# Patient Record
Sex: Female | Born: 1945 | Race: White | Hispanic: No | Smoking: Former smoker
Health system: Southern US, Community
[De-identification: ages and names within clinical notes are randomized; demographics above are authoritative.]

## PROBLEM LIST (undated history)

## (undated) DIAGNOSIS — E119 Type 2 diabetes mellitus without complications: Secondary | ICD-10-CM

## (undated) DIAGNOSIS — R42 Dizziness and giddiness: Secondary | ICD-10-CM

## (undated) DIAGNOSIS — I739 Peripheral vascular disease, unspecified: Secondary | ICD-10-CM

## (undated) DIAGNOSIS — C801 Malignant (primary) neoplasm, unspecified: Secondary | ICD-10-CM

## (undated) DIAGNOSIS — I10 Essential (primary) hypertension: Secondary | ICD-10-CM

## (undated) DIAGNOSIS — K760 Fatty (change of) liver, not elsewhere classified: Secondary | ICD-10-CM

## (undated) DIAGNOSIS — E785 Hyperlipidemia, unspecified: Secondary | ICD-10-CM

## (undated) DIAGNOSIS — E039 Hypothyroidism, unspecified: Secondary | ICD-10-CM

## (undated) HISTORY — PX: COLON SURGERY: SHX602

## (undated) HISTORY — PX: TONSILLECTOMY: SUR1361

## (undated) HISTORY — PX: HERNIA REPAIR: SHX51

## (undated) HISTORY — PX: EYE SURGERY: SHX253

## (undated) HISTORY — PX: ABDOMINAL HYSTERECTOMY: SHX81

---

## 2004-11-07 ENCOUNTER — Ambulatory Visit: Payer: Self-pay | Admitting: Family Medicine

## 2005-10-04 ENCOUNTER — Ambulatory Visit: Payer: Self-pay | Admitting: Unknown Physician Specialty

## 2005-10-11 ENCOUNTER — Ambulatory Visit: Payer: Self-pay | Admitting: Unknown Physician Specialty

## 2006-02-06 ENCOUNTER — Ambulatory Visit: Payer: Self-pay | Admitting: Unknown Physician Specialty

## 2006-10-23 ENCOUNTER — Ambulatory Visit: Payer: Self-pay | Admitting: Unknown Physician Specialty

## 2008-03-02 ENCOUNTER — Ambulatory Visit: Payer: Self-pay | Admitting: Unknown Physician Specialty

## 2009-03-08 ENCOUNTER — Ambulatory Visit: Payer: Self-pay | Admitting: Unknown Physician Specialty

## 2009-12-23 ENCOUNTER — Ambulatory Visit: Payer: Self-pay | Admitting: Unknown Physician Specialty

## 2010-04-04 ENCOUNTER — Ambulatory Visit: Payer: Self-pay | Admitting: General Surgery

## 2010-04-10 ENCOUNTER — Ambulatory Visit: Payer: Self-pay | Admitting: General Surgery

## 2010-04-11 ENCOUNTER — Ambulatory Visit: Payer: Self-pay | Admitting: General Surgery

## 2010-10-06 ENCOUNTER — Ambulatory Visit: Payer: Self-pay | Admitting: Unknown Physician Specialty

## 2011-02-14 ENCOUNTER — Ambulatory Visit: Payer: Self-pay | Admitting: Unknown Physician Specialty

## 2012-02-19 ENCOUNTER — Ambulatory Visit: Payer: Self-pay | Admitting: Unknown Physician Specialty

## 2012-07-23 ENCOUNTER — Ambulatory Visit: Payer: Self-pay | Admitting: Urology

## 2013-02-19 ENCOUNTER — Ambulatory Visit: Payer: Self-pay | Admitting: Physician Assistant

## 2014-02-24 ENCOUNTER — Ambulatory Visit: Payer: Self-pay | Admitting: Physician Assistant

## 2014-03-05 ENCOUNTER — Ambulatory Visit: Payer: Self-pay | Admitting: Unknown Physician Specialty

## 2014-03-08 LAB — PATHOLOGY REPORT

## 2014-04-26 DIAGNOSIS — M654 Radial styloid tenosynovitis [de Quervain]: Secondary | ICD-10-CM | POA: Insufficient documentation

## 2014-06-02 DIAGNOSIS — E785 Hyperlipidemia, unspecified: Secondary | ICD-10-CM | POA: Insufficient documentation

## 2014-06-08 DIAGNOSIS — N6019 Diffuse cystic mastopathy of unspecified breast: Secondary | ICD-10-CM | POA: Insufficient documentation

## 2014-06-08 DIAGNOSIS — K760 Fatty (change of) liver, not elsewhere classified: Secondary | ICD-10-CM | POA: Insufficient documentation

## 2015-01-22 NOTE — Op Note (Signed)
PATIENT NAME:  Nicole Bennett, Nicole Bennett MR#:  [id number] DATE OF BIRTH:  Feb 28, 1946  DATE OF PROCEDURE:  03/05/2014  SURGEON: Manya Silvas, MD  PROCEDURE: A colonoscopy with polypectomy.   PREOPERATIVE INDICATION: Personal history of colon polyps.   POSTOPERATIVE DIAGNOSES: Colon polyp in the cecum and in the rectosigmoid area, internal hemorrhoids.   MEDICATIONS: Propofol per nurse, CRNA.  DESCRIPTION OF PROCEDURE: The procedure was done on O2 monitor, blood pressure cuff and oximeter. Once the scope was advanced to the cecum without difficulty, in the cecum, a small to medium polyp was seen. Saline lift was performed, and the polyp was removed with a snare polypectomy. One small remnant was removed with the cold biopsy forceps. A clip was placed at the site of the polypectomy with good results. The remainder of the colon showed no significant abnormalities. A small polyp was seen in the rectosigmoid area, and this was removed with cold biopsy jumbo forceps with 2 bites. Internal hemorrhoids were seen. The prep was excellent. Anorectal exam showed internal hemorrhoids.   ASSESSMENT: Colon polyps, internal hemorrhoids. Await pathology report.   ____________________________ Manya Silvas, MD rte:lb D: 03/05/2014 11:41:08 ET T: 03/05/2014 11:48:51 ET JOB#: [id number]  cc: Manya Silvas, MD, <Dictator> Manya Silvas MD ELECTRONICALLY SIGNED 03/15/2014 18:02

## 2015-02-09 ENCOUNTER — Other Ambulatory Visit: Payer: Self-pay | Admitting: Physician Assistant

## 2015-02-09 DIAGNOSIS — Z1231 Encounter for screening mammogram for malignant neoplasm of breast: Secondary | ICD-10-CM

## 2015-03-01 ENCOUNTER — Ambulatory Visit
Admission: RE | Admit: 2015-03-01 | Discharge: 2015-03-01 | Disposition: A | Discharge status: Home / Self Care | Payer: Medicare Other | Source: Ambulatory Visit | Attending: Physician Assistant | Admitting: Physician Assistant

## 2015-03-01 ENCOUNTER — Other Ambulatory Visit: Payer: Self-pay | Admitting: Physician Assistant

## 2015-03-01 DIAGNOSIS — Z1231 Encounter for screening mammogram for malignant neoplasm of breast: Secondary | ICD-10-CM | POA: Diagnosis present

## 2016-02-16 ENCOUNTER — Other Ambulatory Visit: Payer: Self-pay | Admitting: Physician Assistant

## 2016-02-16 DIAGNOSIS — Z1231 Encounter for screening mammogram for malignant neoplasm of breast: Secondary | ICD-10-CM

## 2016-03-02 ENCOUNTER — Ambulatory Visit
Admission: RE | Admit: 2016-03-02 | Discharge: 2016-03-02 | Disposition: A | Discharge status: Home / Self Care | Payer: Medicare Other | Source: Ambulatory Visit | Attending: Physician Assistant | Admitting: Physician Assistant

## 2016-03-02 ENCOUNTER — Other Ambulatory Visit: Payer: Self-pay | Admitting: Physician Assistant

## 2016-03-02 DIAGNOSIS — Z1231 Encounter for screening mammogram for malignant neoplasm of breast: Secondary | ICD-10-CM | POA: Diagnosis present

## 2017-05-01 ENCOUNTER — Other Ambulatory Visit: Payer: Self-pay | Admitting: Physician Assistant

## 2017-05-03 ENCOUNTER — Other Ambulatory Visit: Payer: Self-pay | Admitting: Physician Assistant

## 2017-05-03 DIAGNOSIS — Z1231 Encounter for screening mammogram for malignant neoplasm of breast: Secondary | ICD-10-CM

## 2017-05-20 ENCOUNTER — Ambulatory Visit
Admission: RE | Admit: 2017-05-20 | Discharge: 2017-05-20 | Disposition: A | Discharge status: Home / Self Care | Payer: Medicare Other | Source: Ambulatory Visit | Attending: Physician Assistant | Admitting: Physician Assistant

## 2017-05-20 DIAGNOSIS — Z1231 Encounter for screening mammogram for malignant neoplasm of breast: Secondary | ICD-10-CM | POA: Insufficient documentation

## 2017-08-06 DIAGNOSIS — H02403 Unspecified ptosis of bilateral eyelids: Secondary | ICD-10-CM | POA: Insufficient documentation

## 2017-08-06 DIAGNOSIS — H02834 Dermatochalasis of left upper eyelid: Secondary | ICD-10-CM | POA: Insufficient documentation

## 2017-08-06 DIAGNOSIS — H02831 Dermatochalasis of right upper eyelid: Secondary | ICD-10-CM | POA: Insufficient documentation

## 2018-04-02 ENCOUNTER — Encounter: Payer: Self-pay | Admitting: *Deleted

## 2018-04-07 ENCOUNTER — Encounter
Admission: RE | Disposition: A | Discharge status: Home / Self Care | Payer: Self-pay | Source: Ambulatory Visit | Attending: Unknown Physician Specialty

## 2018-04-07 ENCOUNTER — Encounter: Payer: Self-pay | Admitting: *Deleted

## 2018-04-07 ENCOUNTER — Ambulatory Visit: Payer: Medicare Other | Admitting: Anesthesiology

## 2018-04-07 ENCOUNTER — Ambulatory Visit
Admission: RE | Admit: 2018-04-07 | Discharge: 2018-04-07 | Disposition: A | Discharge status: Home / Self Care | Payer: Medicare Other | Source: Ambulatory Visit | Attending: Unknown Physician Specialty | Admitting: Unknown Physician Specialty

## 2018-04-07 DIAGNOSIS — E785 Hyperlipidemia, unspecified: Secondary | ICD-10-CM | POA: Diagnosis not present

## 2018-04-07 DIAGNOSIS — I10 Essential (primary) hypertension: Secondary | ICD-10-CM | POA: Diagnosis not present

## 2018-04-07 DIAGNOSIS — E039 Hypothyroidism, unspecified: Secondary | ICD-10-CM | POA: Diagnosis not present

## 2018-04-07 DIAGNOSIS — E1151 Type 2 diabetes mellitus with diabetic peripheral angiopathy without gangrene: Secondary | ICD-10-CM | POA: Diagnosis not present

## 2018-04-07 DIAGNOSIS — Z79890 Hormone replacement therapy (postmenopausal): Secondary | ICD-10-CM | POA: Insufficient documentation

## 2018-04-07 DIAGNOSIS — D123 Benign neoplasm of transverse colon: Secondary | ICD-10-CM | POA: Insufficient documentation

## 2018-04-07 DIAGNOSIS — Z85038 Personal history of other malignant neoplasm of large intestine: Secondary | ICD-10-CM | POA: Insufficient documentation

## 2018-04-07 DIAGNOSIS — Z7984 Long term (current) use of oral hypoglycemic drugs: Secondary | ICD-10-CM | POA: Insufficient documentation

## 2018-04-07 DIAGNOSIS — Z8719 Personal history of other diseases of the digestive system: Secondary | ICD-10-CM | POA: Diagnosis not present

## 2018-04-07 DIAGNOSIS — R609 Edema, unspecified: Secondary | ICD-10-CM | POA: Diagnosis not present

## 2018-04-07 DIAGNOSIS — Z79899 Other long term (current) drug therapy: Secondary | ICD-10-CM | POA: Diagnosis not present

## 2018-04-07 DIAGNOSIS — K635 Polyp of colon: Secondary | ICD-10-CM | POA: Insufficient documentation

## 2018-04-07 DIAGNOSIS — Z791 Long term (current) use of non-steroidal anti-inflammatories (NSAID): Secondary | ICD-10-CM | POA: Diagnosis not present

## 2018-04-07 DIAGNOSIS — Z1211 Encounter for screening for malignant neoplasm of colon: Secondary | ICD-10-CM | POA: Insufficient documentation

## 2018-04-07 DIAGNOSIS — K640 First degree hemorrhoids: Secondary | ICD-10-CM | POA: Insufficient documentation

## 2018-04-07 DIAGNOSIS — Z87891 Personal history of nicotine dependence: Secondary | ICD-10-CM | POA: Diagnosis not present

## 2018-04-07 HISTORY — DX: Type 2 diabetes mellitus without complications: E11.9

## 2018-04-07 HISTORY — DX: Dizziness and giddiness: R42

## 2018-04-07 HISTORY — DX: Hyperlipidemia, unspecified: E78.5

## 2018-04-07 HISTORY — DX: Hypothyroidism, unspecified: E03.9

## 2018-04-07 HISTORY — DX: Peripheral vascular disease, unspecified: I73.9

## 2018-04-07 HISTORY — DX: Fatty (change of) liver, not elsewhere classified: K76.0

## 2018-04-07 HISTORY — DX: Malignant (primary) neoplasm, unspecified: C80.1

## 2018-04-07 HISTORY — PX: COLONOSCOPY WITH PROPOFOL: SHX5780

## 2018-04-07 HISTORY — DX: Essential (primary) hypertension: I10

## 2018-04-07 LAB — GLUCOSE, CAPILLARY: Glucose-Capillary: 115 mg/dL — ABNORMAL HIGH (ref 70–99)

## 2018-04-07 SURGERY — COLONOSCOPY WITH PROPOFOL
Anesthesia: General

## 2018-04-07 MED ORDER — PROPOFOL 500 MG/50ML IV EMUL
INTRAVENOUS | Status: DC | PRN
  Administered 2018-04-07: 180 ug/kg/min via INTRAVENOUS

## 2018-04-07 MED ORDER — PROPOFOL 500 MG/50ML IV EMUL
INTRAVENOUS | Status: AC
  Filled 2018-04-07: qty 50

## 2018-04-07 MED ORDER — SODIUM CHLORIDE 0.9 % IV SOLN
INTRAVENOUS | Status: DC
  Administered 2018-04-07 (×2): via INTRAVENOUS

## 2018-04-07 MED ORDER — SODIUM CHLORIDE 0.9 % IV SOLN: INTRAVENOUS | Status: DC

## 2018-04-07 NOTE — Anesthesia Post-op Follow-up Note (Signed)
Anesthesia QCDR form completed.        

## 2018-04-07 NOTE — Anesthesia Preprocedure Evaluation (Signed)
Anesthesia Evaluation  Patient identified by MRN, date of birth, ID band Patient awake    Reviewed: Allergy & Precautions, NPO status , Patient's Chart, lab work & pertinent test results  History of Anesthesia Complications (+) PONVNegative for: history of anesthetic complications  Airway Mallampati: II       Dental   Pulmonary neg sleep apnea, neg COPD, former smoker,           Cardiovascular hypertension (off meds), + Peripheral Vascular Disease  (-) Past MI and (-) CHF (-) dysrhythmias (-) Valvular Problems/Murmurs     Neuro/Psych neg Seizures    GI/Hepatic Neg liver ROS, neg GERD  ,  Endo/Other  diabetes (off meds), Type 2Hypothyroidism   Renal/GU negative Renal ROS     Musculoskeletal   Abdominal   Peds  Hematology   Anesthesia Other Findings   Reproductive/Obstetrics                             Anesthesia Physical Anesthesia Plan  ASA: II  Anesthesia Plan: General   Post-op Pain Management:    Induction: Intravenous  PONV Risk Score and Plan: Midazolam, TIVA and Propofol infusion  Airway Management Planned: Nasal Cannula  Additional Equipment:   Intra-op Plan:   Post-operative Plan:   Informed Consent: I have reviewed the patients History and Physical, chart, labs and discussed the procedure including the risks, benefits and alternatives for the proposed anesthesia with the patient or authorized representative who has indicated his/her understanding and acceptance.     Plan Discussed with:   Anesthesia Plan Comments:         Anesthesia Quick Evaluation

## 2018-04-07 NOTE — Transfer of Care (Signed)
Immediate Anesthesia Transfer of Care Note  Patient: Nicole Bennett  Procedure(s) Performed: COLONOSCOPY WITH PROPOFOL (N/A )  Patient Location: PACU  Anesthesia Type:General  Level of Consciousness: sedated  Airway & Oxygen Therapy: Patient connected to nasal cannula oxygen  Post-op Assessment: Post -op Vital signs reviewed and stable  Post vital signs: stable  Last Vitals:  Vitals Value Taken Time  BP 108/54 04/07/2018  4:27 PM  Temp 36.1 C 04/07/2018  4:27 PM  Pulse 81 04/07/2018  4:27 PM  Resp 19 04/07/2018  4:27 PM  SpO2 98 % 04/07/2018  4:27 PM    Last Pain:  Vitals:   04/07/18 1627  TempSrc: Tympanic  PainSc: 0-No pain         Complications: No apparent anesthesia complications

## 2018-04-07 NOTE — H&P (Signed)
Primary Care Physician:  Marinda Elk, MD Primary Gastroenterologist:  Dr. Vira Agar  Pre-Procedure History & Physical: HPI:  Nicole Bennett is a 72 y.o. female is here for an colonoscopy.  PH colon cancer and colon polyps.   Past Medical History:  Diagnosis Date  . Cancer [facility])    Colon Cancer 1995  . Diabetes mellitus without complication ([place])   . Hepatic steatosis   . Hyperlipidemia   . Hypertension   . Hypothyroidism   . Peripheral vascular disease (HCC)    Aortic Insufficiency  . Vertigo     Past Surgical History:  Procedure Laterality Date  . ABDOMINAL HYSTERECTOMY     1975  . COLON SURGERY     Sigmoid Colectomy 1995  . EYE SURGERY     Bilateral Cataract (2017)  . HERNIA REPAIR     Ventral Hernia (2011)  . TONSILLECTOMY      Prior to Admission medications   Medication Sig Start Date End Date Taking? Authorizing Provider  calcium carbonate (OSCAL) 1500 (600 Ca) MG TABS tablet Take 1,500 mg by mouth daily with breakfast.   Yes [provider]  escitalopram (LEXAPRO) 10 MG tablet Take 10 mg by mouth daily.   Yes [provider]  estradiol (ESTRACE) 0.1 MG/GM vaginal cream Place 2 Applicatorfuls vaginally at bedtime.   Yes [provider]  glimepiride (AMARYL) 1 MG tablet Take 1 mg by mouth daily with breakfast.   Yes [provider]  levothyroxine (SYNTHROID, LEVOTHROID) 100 MCG tablet Take 100 mcg by mouth daily before breakfast.   Yes [provider]  losartan (COZAAR) 100 MG tablet Take 100 mg by mouth daily.   Yes [provider]  Multiple Vitamin (MULTIVITAMIN) tablet Take 1 tablet by mouth daily.   Yes [provider]  simvastatin (ZOCOR) 20 MG tablet Take 20 mg by mouth daily.   Yes [provider]  latanoprost (XALATAN) 0.005 % ophthalmic solution Place 1 drop into both eyes at bedtime.    [provider]  metaxalone (SKELAXIN) 800 MG tablet Take 800 mg by mouth 3 (three)  times daily.    [provider]  nabumetone (RELAFEN) 500 MG tablet Take 1,000 mg by mouth daily.    [provider]    Allergies as of 01/31/2018  . (Not on File)    Family History  Problem Relation Age of Onset  . Breast cancer Sister 57  . Breast cancer Maternal Grandmother   . Diabetes Maternal Grandmother   . Colon cancer Mother 34  . Colon cancer Father 51  . Neuropathy Father        Peripheral Neuropathy    Social History   Socioeconomic History  . Marital status: Divorced    Spouse name: Not on file  . Number of children: Not on file  . Years of education: Not on file  . Highest education level: Not on file  Occupational History  . Not on file  Social Needs  . Financial resource strain: Not on file  . Food insecurity:    Worry: Not on file    Inability: Not on file  . Transportation needs:    Medical: Not on file    Non-medical: Not on file  Tobacco Use  . Smoking status: Former Smoker    Packs/day: 1.00    Years: 10.00    Pack years: 10.00    Types: Cigarettes    Last attempt to quit: 10/01/1985    Years  since quitting: 32.5  . Smokeless tobacco: Never Used  Substance and Sexual Activity  . Alcohol use: Never    Frequency: Never  . Drug use: Never  . Sexual activity: Not on file  Lifestyle  . Physical activity:    Days per week: Not on file    Minutes per session: Not on file  . Stress: Not on file  Relationships  . Social connections:    Talks on phone: Not on file    Gets together: Not on file    Attends religious service: Not on file    Active member of club or organization: Not on file    Attends meetings of clubs or organizations: Not on file    Relationship status: Not on file  . Intimate partner violence:    Fear of current or ex partner: Not on file    Emotionally abused: Not on file    Physically abused: Not on file    Forced sexual activity: Not on file  Other Topics Concern  . Not on file  Social History  Narrative  . Not on file    Review of Systems: See HPI, otherwise negative ROS  Physical Exam: BP 137/82   Pulse 80   Temp (!) 97.2 F (36.2 C) (Tympanic)   Resp 18   Ht 5\' 7"  (1.702 m)   Wt 88.5 kg (195 lb)   SpO2 98%   BMI 30.54 kg/m  General:   Alert,  pleasant and cooperative in NAD Head:  Normocephalic and atraumatic. Neck:  Supple; no masses or thyromegaly. Lungs:  Clear throughout to auscultation.    Heart:  Regular rate and rhythm. Abdomen:  Soft, nontender and nondistended. Normal bowel sounds, without guarding, and without rebound.   Neurologic:  Alert and  oriented x4;  grossly normal neurologically.  Impression/Plan: Nicole Bennett is here for an colonoscopy to be performed for Personal history of colon polyps and colon cancer.  Risks, benefits, limitations, and alternatives regarding  colonoscopy have been reviewed with the patient.  Questions have been answered.  All parties agreeable.   Gaylyn Cheers, MD  04/07/2018, 3:40 PM

## 2018-04-07 NOTE — Anesthesia Postprocedure Evaluation (Signed)
Anesthesia Post Note  Patient: Nicole Bennett  Procedure(s) Performed: COLONOSCOPY WITH PROPOFOL (N/A )  Patient location during evaluation: Endoscopy Anesthesia Type: General Level of consciousness: awake and alert Pain management: pain level controlled Vital Signs Assessment: post-procedure vital signs reviewed and stable Respiratory status: spontaneous breathing and respiratory function stable Cardiovascular status: stable Anesthetic complications: no     Last Vitals:  Vitals:   04/07/18 1509 04/07/18 1627  BP: 137/82 (!) 108/54  Pulse: 80 81  Resp: 18 19  Temp: (!) 36.2 C (!) 36.1 C  SpO2: 98% 98%    Last Pain:  Vitals:   04/07/18 1627  TempSrc: Tympanic  PainSc: 0-No pain                 KEPHART,WILLIAM K

## 2018-04-07 NOTE — Op Note (Signed)
[facility] Patient Name: Nicole Bennett Procedure Date: 04/07/2018 3:42 PM MRN: [id number] Account #: [id number] Date of Birth: 11-14-1945 Admit Type: Outpatient Age: 72 Room: [facility] ENDO ROOM 3 Gender: Female Note Status: Finalized Procedure:            Colonoscopy Indications:          High risk colon cancer surveillance: Personal history                        of colonic polyps, High risk colon cancer surveillance:                        Personal history of colon cancer Providers:            Manya Silvas, MD Referring MD:         Precious Bard, MD (Referring MD) Medicines:            Propofol per Anesthesia Complications:        No immediate complications. Procedure:            Pre-Anesthesia Assessment:                       - After reviewing the risks and benefits, the patient                        was deemed in satisfactory condition to undergo the                        procedure.                       After obtaining informed consent, the colonoscope was                        passed under direct vision. Throughout the procedure,                        the patient's blood pressure, pulse, and oxygen                        saturations were monitored continuously. The                        Colonoscope was introduced through the anus and                        advanced to the the cecum, identified by appendiceal                        orifice and ileocecal valve. The colonoscopy was                        performed without difficulty. The patient tolerated the                        procedure well. The quality of the bowel preparation                        was good. Findings:      A diminutive polyp was found in the ascending colon. The polyp was  sessile. The polyp was removed with a jumbo cold forceps. Resection and       retrieval were complete.      Two sessile polyps were found in the transverse colon. The polyps were        small in size. These polyps were removed with a hot snare. Resection and       retrieval were complete.      A diminutive polyp was found in the transverse colon. The polyp was       sessile. The polyp was removed with a jumbo cold forceps. Resection and       retrieval were complete.      Two sessile polyps were found in the recto-sigmoid colon. The polyps       were small in size. These polyps were removed with a jumbo cold forceps.       Resection and retrieval were complete.      Internal hemorrhoids were found during endoscopy. The hemorrhoids were       small and Grade I (internal hemorrhoids that do not prolapse).      The exam was otherwise without abnormality. Impression:           - One diminutive polyp in the ascending colon, removed                        with a jumbo cold forceps. Resected and retrieved.                       - Two small polyps in the transverse colon, removed                        with a hot snare. Resected and retrieved.                       - One diminutive polyp in the transverse colon, removed                        with a jumbo cold forceps. Resected and retrieved.                       - Two small polyps at the recto-sigmoid colon, removed                        with a jumbo cold forceps. Resected and retrieved.                       - Internal hemorrhoids.                       - The examination was otherwise normal. Recommendation:       - Await pathology results. Manya Silvas, MD 04/07/2018 4:25:43 PM This report has been signed electronically. Number of Addenda: 0 Note Initiated On: 04/07/2018 3:42 PM Scope Withdrawal Time: 0 hours 20 minutes 7 seconds  Total Procedure Duration: 0 hours 27 minutes 34 seconds       [facility]

## 2018-04-08 ENCOUNTER — Encounter: Payer: Self-pay | Admitting: Unknown Physician Specialty

## 2018-04-09 LAB — SURGICAL PATHOLOGY

## 2018-05-28 ENCOUNTER — Other Ambulatory Visit: Payer: Self-pay | Admitting: Physician Assistant

## 2018-05-28 DIAGNOSIS — Z1231 Encounter for screening mammogram for malignant neoplasm of breast: Secondary | ICD-10-CM

## 2018-06-30 ENCOUNTER — Ambulatory Visit
Admission: RE | Admit: 2018-06-30 | Discharge: 2018-06-30 | Disposition: A | Discharge status: Home / Self Care | Payer: Medicare Other | Source: Ambulatory Visit | Attending: Physician Assistant | Admitting: Physician Assistant

## 2018-06-30 DIAGNOSIS — Z1231 Encounter for screening mammogram for malignant neoplasm of breast: Secondary | ICD-10-CM | POA: Diagnosis present

## 2018-11-20 ENCOUNTER — Encounter: Payer: Self-pay | Admitting: Urology

## 2018-11-20 ENCOUNTER — Ambulatory Visit (INDEPENDENT_AMBULATORY_CARE_PROVIDER_SITE_OTHER): Payer: Medicare Other | Admitting: Urology

## 2018-11-20 VITALS — BP 143/75 | HR 71 | Ht 66.0 in | Wt 205.0 lb

## 2018-11-20 DIAGNOSIS — R3129 Other microscopic hematuria: Secondary | ICD-10-CM | POA: Diagnosis not present

## 2018-11-20 LAB — URINALYSIS, COMPLETE
BILIRUBIN UA: NEGATIVE
Glucose, UA: NEGATIVE
Ketones, UA: NEGATIVE
Nitrite, UA: NEGATIVE
PH UA: 5 (ref 5.0–7.5)
PROTEIN UA: NEGATIVE
Specific Gravity, UA: 1.025 (ref 1.005–1.030)
UUROB: 1 mg/dL (ref 0.2–1.0)

## 2018-11-20 LAB — MICROSCOPIC EXAMINATION

## 2018-11-20 MED ORDER — ESTRADIOL 0.1 MG/GM VA CREA: 2.0000 | TOPICAL_CREAM | Freq: Every day | VAGINAL | 11 refills | Status: DC

## 2018-11-20 NOTE — Patient Instructions (Signed)
Urinary Tract Infection, Adult A urinary tract infection (UTI) is an infection of any part of the urinary tract. The urinary tract includes:  The kidneys.  The ureters.  The bladder.  The urethra. These organs make, store, and get rid of pee (urine) in the body. What are the causes? This is caused by germs (bacteria) in your genital area. These germs grow and cause swelling (inflammation) of your urinary tract. What increases the risk? You are more likely to develop this condition if:  You have a small, thin tube (catheter) to drain pee.  You cannot control when you pee or poop (incontinence).  You are female, and: ? You use these methods to prevent pregnancy: ? A medicine that kills sperm (spermicide). ? A device that blocks sperm (diaphragm). ? You have low levels of a female hormone (estrogen). ? You are pregnant.  You have genes that add to your risk.  You are sexually active.  You take antibiotic medicines.  You have trouble peeing because of: ? A prostate that is bigger than normal, if you are female. ? A blockage in the part of your body that drains pee from the bladder (urethra). ? A kidney stone. ? A nerve condition that affects your bladder (neurogenic bladder). ? Not getting enough to drink. ? Not peeing often enough.  You have other conditions, such as: ? Diabetes. ? A weak disease-fighting system (immune system). ? Sickle cell disease. ? Gout. ? Injury of the spine. What are the signs or symptoms? Symptoms of this condition include:  Needing to pee right away (urgently).  Peeing often.  Peeing small amounts often.  Pain or burning when peeing.  Blood in the pee.  Pee that smells bad or not like normal.  Trouble peeing.  Pee that is cloudy.  Fluid coming from the vagina, if you are female.  Pain in the belly or lower back. Other symptoms include:  Throwing up (vomiting).  No urge to eat.  Feeling mixed up (confused).  Being tired  and grouchy (irritable).  A fever.  Watery poop (diarrhea). How is this treated? This condition may be treated with:  Antibiotic medicine.  Other medicines.  Drinking enough water. Follow these instructions at home:  Medicines  Take over-the-counter and prescription medicines only as told by your doctor.  If you were prescribed an antibiotic medicine, take it as told by your doctor. Do not stop taking it even if you start to feel better. General instructions  Make sure you: ? Pee until your bladder is empty. ? Do not hold pee for a long time. ? Empty your bladder after sex. ? Wipe from front to back after pooping if you are a female. Use each tissue one time when you wipe.  Drink enough fluid to keep your pee pale yellow.  Keep all follow-up visits as told by your doctor. This is important. Contact a doctor if:  You do not get better after 1-2 days.  Your symptoms go away and then come back. Get help right away if:  You have very bad back pain.  You have very bad pain in your lower belly.  You have a fever.  You are sick to your stomach (nauseous).  You are throwing up. Summary  A urinary tract infection (UTI) is an infection of any part of the urinary tract.  This condition is caused by germs in your genital area.  There are many risk factors for a UTI. These include having a small, thin   tube to drain pee and not being able to control when you pee or poop.  Treatment includes antibiotic medicines for germs.  Drink enough fluid to keep your pee pale yellow. This information is not intended to replace advice given to you by your health care provider. Make sure you discuss any questions you have with your health care provider. Document Released: 03/05/2008 Document Revised: 03/27/2018 Document Reviewed: 03/27/2018 Elsevier Interactive Patient Education  2019 Elsevier Inc.  

## 2018-11-20 NOTE — Progress Notes (Signed)
11/20/2018 12:15 PM   [name] 1945/11/15 [id number]  Referring provider: Marinda Elk, MD [place], [place] [address]  CC: Microscopic hematuria  HPI: I saw Nicole Bennett in urology clinic today in consultation for microscopic hematuria from Dr. Carrie Mew.  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  This is been present at least 5 years on chart review.  Denies any history of gross hematuria or blood clots.  She underwent a microscopic hematuria work-up with Dr. Ernst Spell in 2013 with a negative CT scan and reportedly negative clinic cystoscopy.  She has a 5-pack-year smoking history, and quit over 40 years ago.  She has a history of recurrent urinary tract infections that had resolved with topical estrogen cream, which she continues to use.  She was previously followed by Dr. Jacqlyn Larsen for this.  There are no aggravating or alleviating factors.  Severity is mild.  She denies any urinary tract infections over the last year.   PMH: Past Medical History:  Diagnosis Date  . Cancer [facility])    Colon Cancer 1995  . Diabetes mellitus without complication ([place])   . Hepatic steatosis   . Hyperlipidemia   . Hypertension   . Hypothyroidism   . Peripheral vascular disease ([facility])    Aortic Insufficiency  . Vertigo     Surgical History: Past Surgical History:  Procedure Laterality Date  . ABDOMINAL HYSTERECTOMY     1975  . COLON SURGERY     Sigmoid Colectomy 1995  . COLONOSCOPY WITH PROPOFOL N/A 04/07/2018   Procedure: COLONOSCOPY WITH PROPOFOL;  Surgeon: Manya Silvas, MD;  Location: [facility] ENDOSCOPY;  Service: Endoscopy;  Laterality: N/A;  . EYE SURGERY     Bilateral Cataract (2017)  . HERNIA REPAIR     Ventral Hernia (2011)  . TONSILLECTOMY      Allergies:  Allergies  Allergen Reactions  . Ace  Inhibitors Cough  . Metformin And Related Nausea Only    Family History: Family History  Problem Relation Age of Onset  . Breast cancer Sister 54  . Breast cancer Maternal Grandmother   . Diabetes Maternal Grandmother   . Colon cancer Mother 67  . Colon cancer Father 56  . Neuropathy Father        Peripheral Neuropathy    Social History:  reports that she quit smoking about 33 years ago. Her smoking use included cigarettes. She has a 10.00 pack-year smoking history. She has never used smokeless tobacco. She reports that she does not drink alcohol or use drugs.  ROS: Please see flowsheet from today's date for complete review of systems.  Physical Exam: BP (!) 143/75   Pulse 71   Ht 5\' 6"  (1.676 m)   Wt 205 lb (93 kg)   BMI 33.09 kg/m    Constitutional:  Alert and oriented, No acute distress. Cardiovascular: No clubbing, cyanosis, or edema. Respiratory: Normal respiratory effort, no increased work of breathing. GI: Abdomen is soft, nontender, nondistended, no abdominal masses GU: No CVA tenderness Lymph: No cervical or inguinal lymphadenopathy. Skin: No rashes, bruises or suspicious lesions. Neurologic: Grossly intact, no focal deficits, moving all 4 extremities. Psychiatric: Normal mood and affect.  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  She also has a history of recurrent UTIs that have resolved with topical vaginal estrogen cream.  We discussed basic strategies for UTI prevention today.  We also had a long conversation today about the AUA guidelines recommending work-up with CT urogram and cystoscopy for patients with microscopic and gross hematuria.  She has minimal risk  factors and history of a negative work-up in 2013.  I did offer her repeat work-up, and she would like to defer at this time.  This is very reasonable with her otherwise good health, and minimal risk factors.  I did counsel her extensively that if she develops gross hematuria I would strongly recommend repeating hematuria work-up with CT and cystoscopy.  Topical vaginal estrogen refilled RTC 1 year for symptom check  Billey Co, MD  [place] [address] [place], [place] [address] [phone]

## 2019-08-03 ENCOUNTER — Other Ambulatory Visit: Payer: Self-pay | Admitting: Physician Assistant

## 2019-08-03 DIAGNOSIS — Z1231 Encounter for screening mammogram for malignant neoplasm of breast: Secondary | ICD-10-CM

## 2019-08-20 ENCOUNTER — Encounter: Payer: Self-pay | Admitting: Family Medicine

## 2019-11-18 ENCOUNTER — Ambulatory Visit
Admission: RE | Admit: 2019-11-18 | Discharge: 2019-11-18 | Disposition: A | Discharge status: Home / Self Care | Payer: Medicare Other | Source: Ambulatory Visit | Attending: Physician Assistant | Admitting: Physician Assistant

## 2019-11-18 DIAGNOSIS — Z1231 Encounter for screening mammogram for malignant neoplasm of breast: Secondary | ICD-10-CM | POA: Diagnosis present

## 2019-11-23 ENCOUNTER — Ambulatory Visit: Payer: Medicare Other | Admitting: Urology

## 2019-11-25 ENCOUNTER — Other Ambulatory Visit: Payer: Self-pay

## 2019-11-25 ENCOUNTER — Encounter: Payer: Self-pay | Admitting: Urology

## 2019-11-25 ENCOUNTER — Ambulatory Visit (INDEPENDENT_AMBULATORY_CARE_PROVIDER_SITE_OTHER): Payer: Medicare Other | Admitting: Urology

## 2019-11-25 VITALS — BP 150/80 | HR 80 | Ht 65.0 in | Wt 206.0 lb

## 2019-11-25 DIAGNOSIS — E119 Type 2 diabetes mellitus without complications: Secondary | ICD-10-CM | POA: Insufficient documentation

## 2019-11-25 DIAGNOSIS — R339 Retention of urine, unspecified: Secondary | ICD-10-CM

## 2019-11-25 DIAGNOSIS — I351 Nonrheumatic aortic (valve) insufficiency: Secondary | ICD-10-CM | POA: Insufficient documentation

## 2019-11-25 DIAGNOSIS — N390 Urinary tract infection, site not specified: Secondary | ICD-10-CM | POA: Diagnosis not present

## 2019-11-25 DIAGNOSIS — R3121 Asymptomatic microscopic hematuria: Secondary | ICD-10-CM

## 2019-11-25 DIAGNOSIS — I10 Essential (primary) hypertension: Secondary | ICD-10-CM | POA: Insufficient documentation

## 2019-11-25 DIAGNOSIS — E669 Obesity, unspecified: Secondary | ICD-10-CM | POA: Insufficient documentation

## 2019-11-25 DIAGNOSIS — C801 Malignant (primary) neoplasm, unspecified: Secondary | ICD-10-CM | POA: Insufficient documentation

## 2019-11-25 DIAGNOSIS — E039 Hypothyroidism, unspecified: Secondary | ICD-10-CM | POA: Insufficient documentation

## 2019-11-25 LAB — BLADDER SCAN AMB NON-IMAGING

## 2019-11-25 MED ORDER — ESTRADIOL 0.1 MG/GM VA CREA: TOPICAL_CREAM | VAGINAL | 6 refills | Status: DC

## 2019-11-25 NOTE — Progress Notes (Signed)
   11/25/2019 10:33 AM   Nicole Bennett 1946/08/15 [id number]  Reason for visit: Follow up microscopic hematuria, recurrent UTIs  HPI: I saw Nicole Bennett back in urology clinic for routine follow-up of microscopic hematuria and recurrent UTIs.  She is a 74 year old healthy female that was previously followed by Dr. Ernst Spell and Dr. Jacqlyn Larsen.  She underwent a negative microscopic hematuria work-up with Dr. Ernst Spell in 2013.  She continues to have low-grade microscopic hematuria with 3-10 RBCs.  She has minimal 5-pack-year smoking history and quit 40 years ago, and no other carcinogenic exposures.  She has no history of gross hematuria.  She has deferred repeat hematuria work-up in the past.  She is on Estrace cream for recurrent UTIs, which has worked very well for her and she denies any new UTIs over the last year.  Overall, she feels she is doing well.  PVR today is 50 mL.  We again reviewed the AUA guidelines regarding gross hematuria as well as the new guidelines with changes for the micro hematuria evaluation.  I again offered her a renal ultrasound and cystoscopy for work-up of her microscopic hematuria, and she again deferred.  We discussed the very low, but non-zero, risk of missing a urologic malignancy by deferring work-up.  Her negative work-up previously, and long duration of low-grade microscopic hematuria are both reassuring.  Continue Estrace cream, can be filled by PCP in the future Follow-up as needed  I spent 30 total minutes on the day of the encounter including pre-visit review of the medical record, face-to-face time with the patient, and post visit ordering of labs/imaging/tests.  Billey Co, [facility] [address], [place], [place] [address] [phone]

## 2020-09-27 ENCOUNTER — Other Ambulatory Visit: Payer: Self-pay | Admitting: Physician Assistant

## 2020-09-27 DIAGNOSIS — Z1231 Encounter for screening mammogram for malignant neoplasm of breast: Secondary | ICD-10-CM

## 2020-10-19 IMAGING — MG DIGITAL SCREENING BILAT W/ TOMO W/ CAD
8 series · 8 of 24 positions shown · non-contrast
Comparison: Previous exam(s).

ACR Breast Density Category a: The breast tissue is almost entirely
fatty.

CLINICAL DATA: Screening.

EXAM:
DIGITAL SCREENING BILATERAL MAMMOGRAM WITH TOMO AND CAD

[L MLO synth-2D]
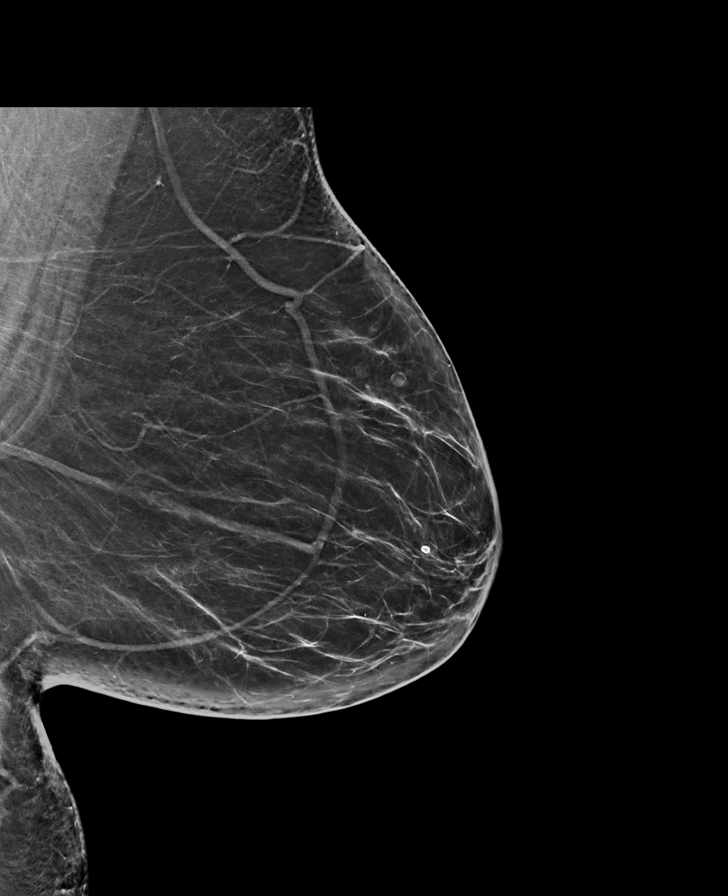

[R MLO synth-2D]
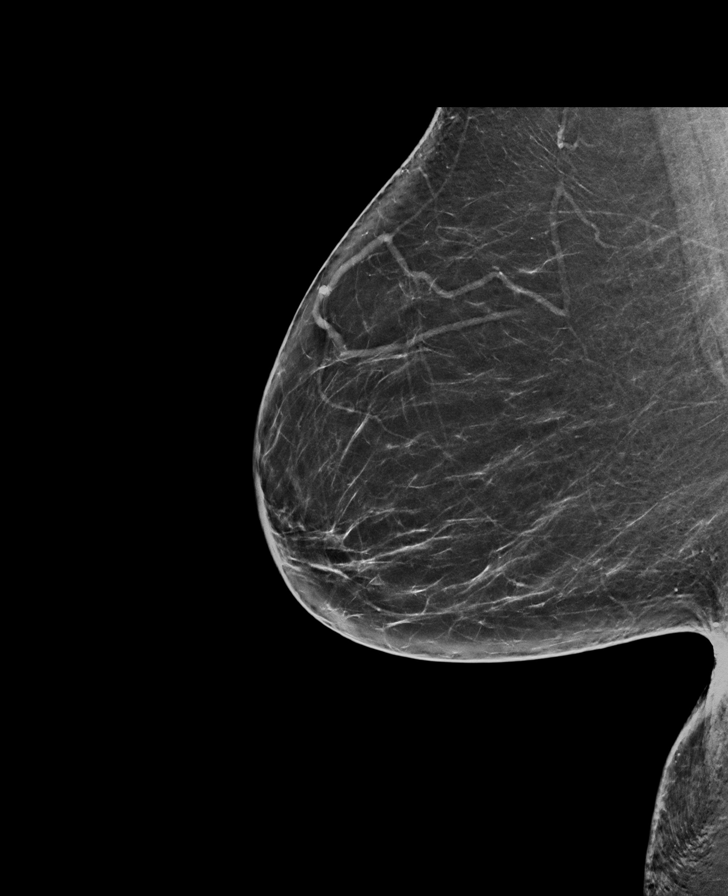

[R CC synth-2D]
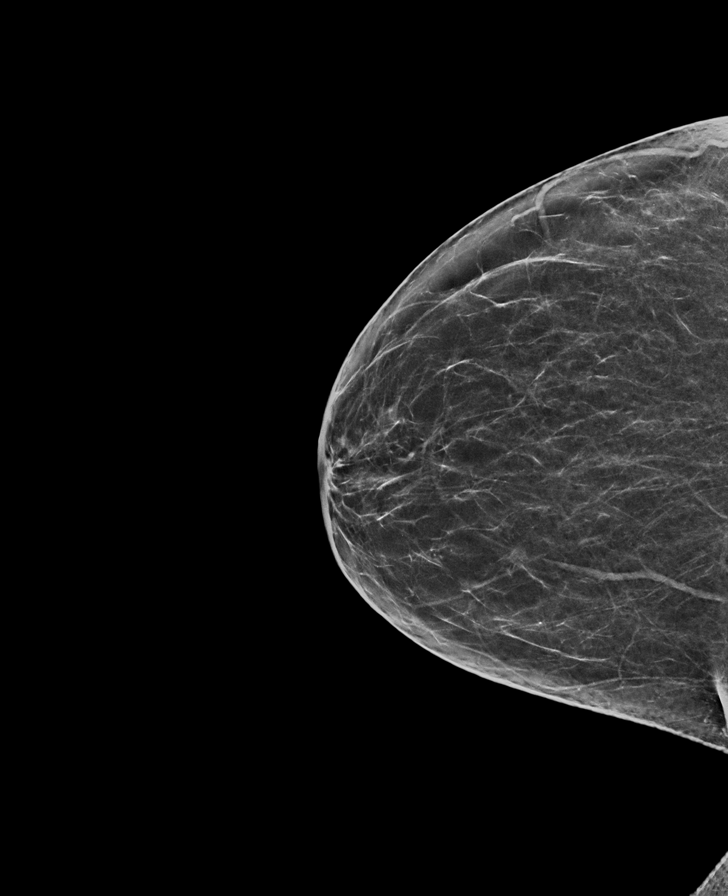

[L CC synth-2D]
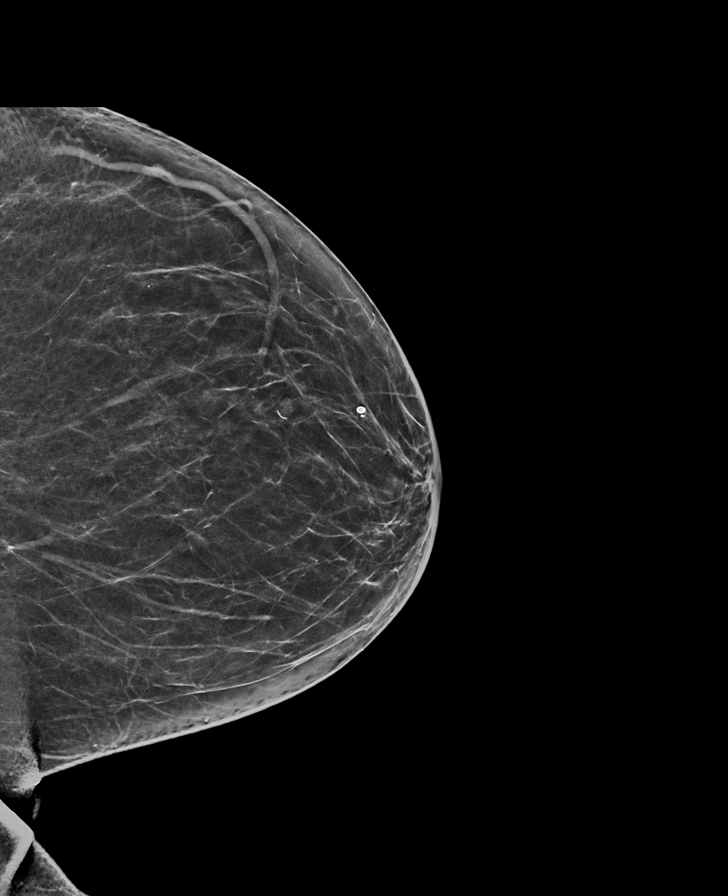

[R MLO tomo · tomo slice 36/71.0]
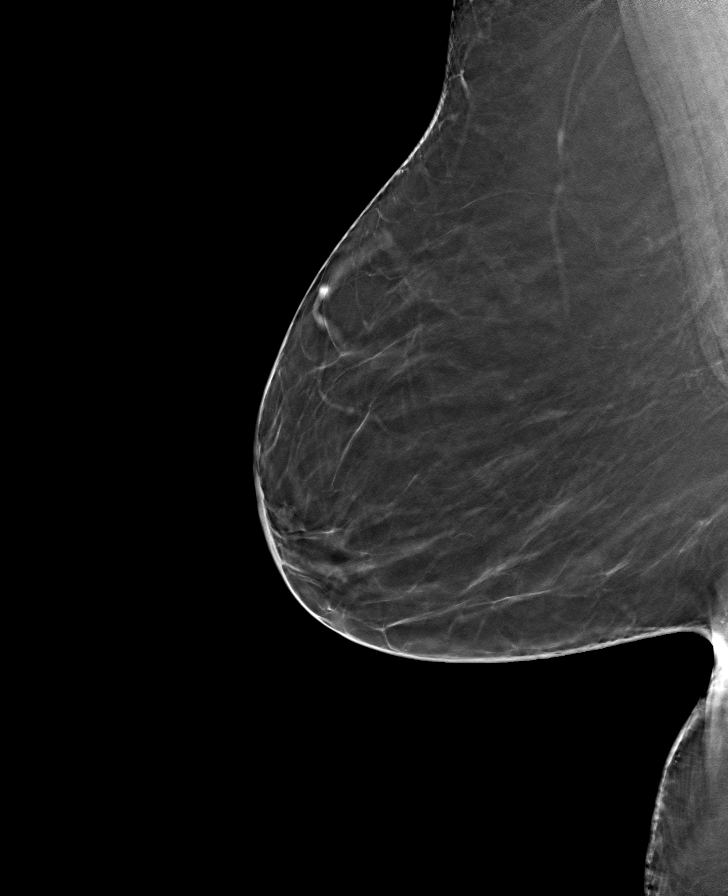

[L CC tomo · tomo slice 33/64.0]
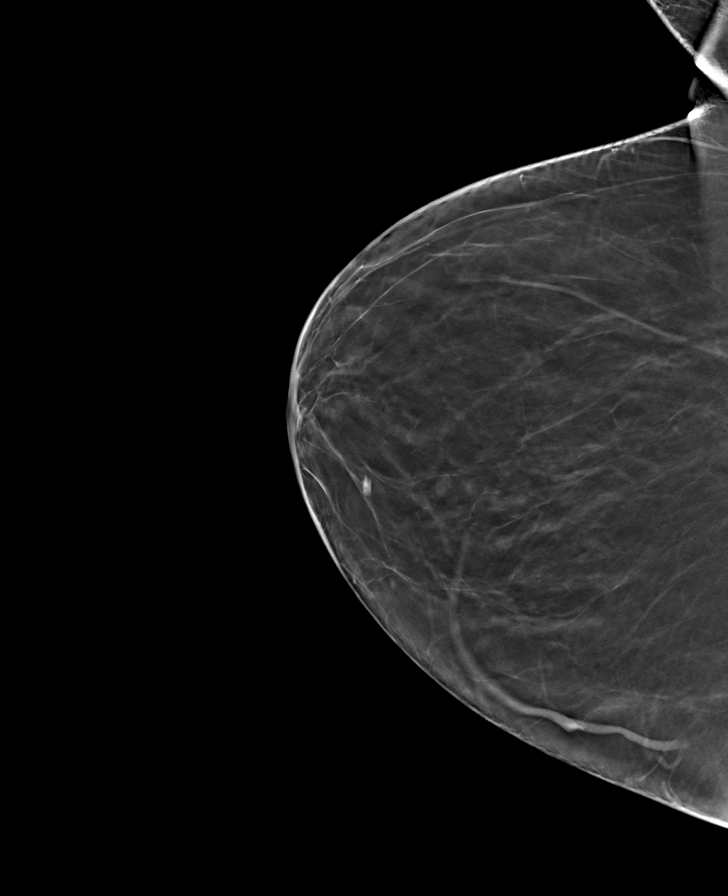

[L MLO tomo · tomo slice 39/78.0]
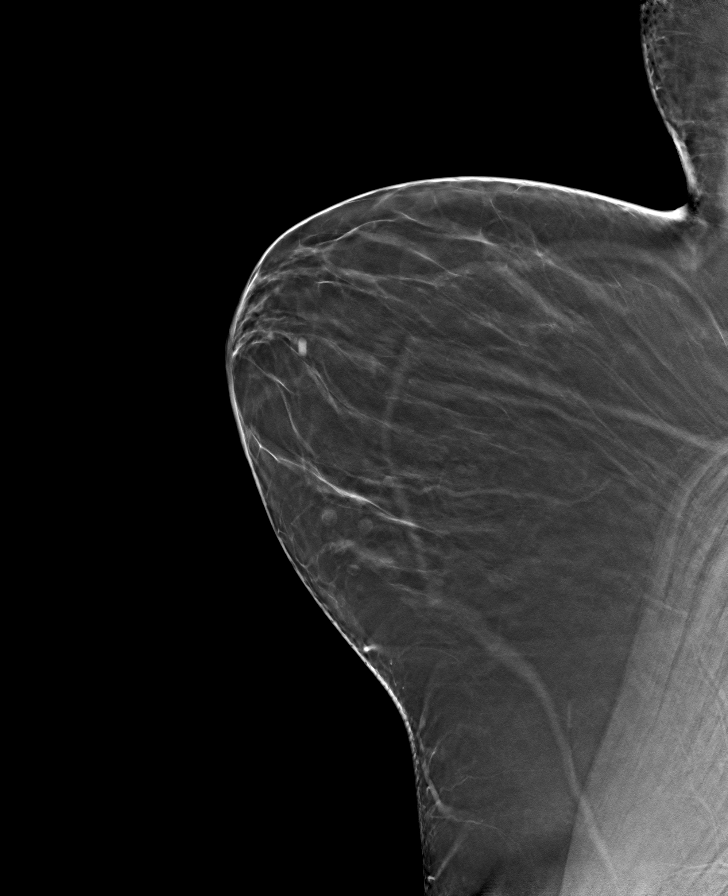

[R CC tomo · tomo slice 33/64.0]
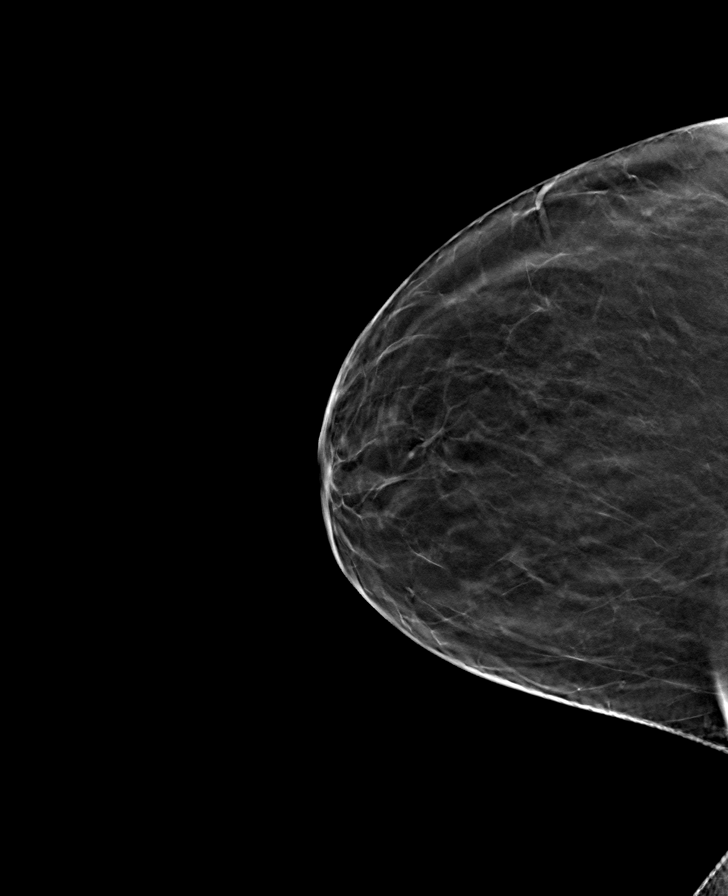

[8 of 24 positions shown; findings below may reference images not displayed]

FINDINGS: There are no findings suspicious for malignancy. Images were
processed with CAD.
IMPRESSION: No mammographic evidence of malignancy. A result letter of this
screening mammogram will be mailed directly to the patient.

RECOMMENDATION:
Screening mammogram in one year. (Code:HQ-7-PI3)

BI-RADS CATEGORY  1: Negative.

Chest please Insert Correct Screening Template

## 2020-11-18 ENCOUNTER — Other Ambulatory Visit: Payer: Self-pay

## 2020-11-18 ENCOUNTER — Ambulatory Visit
Admission: RE | Admit: 2020-11-18 | Discharge: 2020-11-18 | Disposition: A | Discharge status: Home / Self Care | Payer: Medicare Other | Source: Ambulatory Visit | Attending: Physician Assistant | Admitting: Physician Assistant

## 2020-11-18 DIAGNOSIS — Z1231 Encounter for screening mammogram for malignant neoplasm of breast: Secondary | ICD-10-CM | POA: Insufficient documentation

## 2021-05-23 ENCOUNTER — Other Ambulatory Visit: Payer: Self-pay | Admitting: Urology

## 2021-05-23 DIAGNOSIS — N390 Urinary tract infection, site not specified: Secondary | ICD-10-CM

## 2021-05-24 ENCOUNTER — Other Ambulatory Visit: Payer: Self-pay | Admitting: *Deleted

## 2021-05-24 DIAGNOSIS — N390 Urinary tract infection, site not specified: Secondary | ICD-10-CM

## 2021-05-24 MED ORDER — ESTRADIOL 0.1 MG/GM VA CREA: TOPICAL_CREAM | VAGINAL | 6 refills | Status: DC

## 2021-07-06 ENCOUNTER — Encounter: Admission: RE | Payer: Self-pay | Source: Ambulatory Visit

## 2021-07-06 ENCOUNTER — Ambulatory Visit: Admission: RE | Admit: 2021-07-06 | Payer: Medicare Other | Source: Ambulatory Visit

## 2021-07-06 SURGERY — COLONOSCOPY WITH PROPOFOL
Anesthesia: General

## 2021-10-18 ENCOUNTER — Other Ambulatory Visit: Payer: Self-pay | Admitting: Physician Assistant

## 2021-10-18 DIAGNOSIS — Z1231 Encounter for screening mammogram for malignant neoplasm of breast: Secondary | ICD-10-CM

## 2022-03-15 ENCOUNTER — Ambulatory Visit
Admission: RE | Admit: 2022-03-15 | Discharge: 2022-03-15 | Disposition: A | Discharge status: Home / Self Care | Payer: Medicare Other | Source: Ambulatory Visit | Attending: Physician Assistant | Admitting: Physician Assistant

## 2022-03-15 DIAGNOSIS — Z1231 Encounter for screening mammogram for malignant neoplasm of breast: Secondary | ICD-10-CM | POA: Diagnosis present

## 2022-10-23 ENCOUNTER — Ambulatory Visit: Payer: Medicare Other | Admitting: Urology

## 2022-10-23 VITALS — BP 107/61 | HR 77

## 2022-10-23 DIAGNOSIS — N958 Other specified menopausal and perimenopausal disorders: Secondary | ICD-10-CM | POA: Diagnosis not present

## 2022-10-23 DIAGNOSIS — N390 Urinary tract infection, site not specified: Secondary | ICD-10-CM | POA: Diagnosis not present

## 2022-10-23 MED ORDER — ESTRADIOL 0.1 MG/GM VA CREA: TOPICAL_CREAM | VAGINAL | 12 refills | Status: AC

## 2022-10-23 MED ORDER — ESTRADIOL 0.1 MG/GM VA CREA
TOPICAL_CREAM | VAGINAL | 12 refills | Status: DC
Start: 2022-10-23 — End: 2022-10-23

## 2022-10-23 NOTE — Patient Instructions (Signed)
Conjugated Estrogens Vaginal Cream What is this medication? CONJUGATED ESTROGENS (CON ju gate ed ESS troe jenz) relieves the symptoms of menopause, such as vaginal irritation, dryness, or pain during sex. It works by increasing levels of the hormone estrogen in the body. This medication is an estrogen hormone. This medicine may be used for other purposes; ask your health care provider or pharmacist if you have questions. COMMON BRAND NAME(S): Premarin What should I tell my care team before I take this medication? They need to know if you have any of these conditions: Abnormal vaginal bleeding Blood vessel disease or blood clots Breast, cervical, endometrial, or uterine cancer Dementia Diabetes Gallbladder disease Heart disease or recent heart attack High blood pressure High cholesterol High level of calcium in the blood Hysterectomy Kidney disease Liver disease Migraine headaches Protein C deficiency Protein S deficiency Stroke Systemic lupus erythematosus (SLE) Tobacco use An unusual or allergic reaction to estrogens other medications, foods, dyes, or preservatives Pregnant or trying to get pregnant Breast-feeding How should I use this medication? This medication is for use in the vagina only. Do not take by mouth. Follow the directions on the prescription label. Use at bedtime unless otherwise directed by your care team. Use the special applicator supplied with the cream. Wash hands before and after use. Fill the applicator with the cream and remove from the tube. Lie on your back, part and bend your knees. Insert the applicator into the vagina and push the plunger to expel the cream into the vagina. Wash the applicator with warm soapy water and rinse well. Use exactly as directed for the complete length of time prescribed. Do not stop using except on the advice of your care team. Talk to your care team about the use of this medication in children. Special care may be needed. A  patient package insert for the product will be given with each prescription and refill. Read this sheet carefully each time. The sheet may change frequently. Overdosage: If you think you have taken too much of this medicine contact a poison control center or emergency room at once. NOTE: This medicine is only for you. Do not share this medicine with others. What if I miss a dose? If you miss a dose, use it as soon as you can. If it is almost time for your next dose, use only that dose. Do not use double or extra doses. What may interact with this medication? Do not take this medication with any of the following: Aromatase inhibitors, such as aminoglutethimide, anastrozole, exemestane, letrozole, testolactone This medication may also interact with the following: Barbiturates used for inducing sleep or treating seizures Carbamazepine Certain antibiotics used to treat infections Grapefruit juice Medications for fungal infections, such as itraconazole and ketoconazole Raloxifene or tamoxifen Rifabutin Rifampin Rifapentine Ritonavir St. John's Wort Warfarin This list may not describe all possible interactions. Give your health care provider a list of all the medicines, herbs, non-prescription drugs, or dietary supplements you use. Also tell them if you smoke, drink alcohol, or use illegal drugs. Some items may interact with your medicine. What should I watch for while using this medication? Visit your care team for regular checks on your progress. You will need a regular breast and pelvic exam. You should also discuss the need for regular mammograms with your care team, and follow their guidelines. This medication can make your body retain fluid, making your fingers, hands, or ankles swell. Your blood pressure can go up. Contact your care team if you  feel you are retaining fluid. If you have any reason to think you are pregnant; stop taking this medication at once and contact your care  team. Tobacco smoking increases the risk of getting a blood clot or having a stroke, especially if you are more than 77 years old. You are strongly advised not to smoke. If you wear contact lenses and notice visual changes, or if the lenses begin to feel uncomfortable, consult your care team. If you are going to have elective surgery, you may need to stop taking this medication beforehand. Consult your care team for advice prior to scheduling the surgery. What side effects may I notice from receiving this medication? Side effects that you should report to your care team as soon as possible: Allergic reactions or angioedema--skin rash, itching or hives, swelling of the face, eyes, lips, tongue, arms, or legs, trouble swallowing or breathing Blood clot--pain, swelling, or warmth in the leg, shortness of breath, chest pain Breast tissue changes, new lumps, redness, pain, or discharge from the nipple Gallbladder problems--severe stomach pain, nausea, vomiting, fever Heart attack--pain or tightness in the chest, shoulders, arms, or jaw, nausea, shortness of breath, cold or clammy skin, feeling faint or lightheaded Heavy vaginal bleeding Increase in blood pressure Stroke--sudden numbness or weakness of the face, arm, or leg, trouble speaking, confusion, trouble walking, loss of balance or coordination, dizziness, severe headache, change in vision Sudden eye pain or change in vision such as blurry vision, seeing halos around lights, vision loss Unusual vaginal discharge, itching, or odor Side effects that usually do not require medical attention (report to your care team if they continue or are bothersome): Bloating Breast pain or tenderness Dark patches of skin on the face or other sun-exposed areas Hair loss Irregular menstrual cycles or spotting Nausea Stomach pain Swelling of the ankles, hands, or feet This list may not describe all possible side effects. Call your doctor for medical advice  about side effects. You may report side effects to FDA at 1-800-FDA-1088. Where should I keep my medication? Keep out of the reach of children and pets. Store at room temperature between 15 and 30 degrees C (59 and 86 degrees F). Throw away any unused medication after the expiration date. NOTE: This sheet is a summary. It may not cover all possible information. If you have questions about this medicine, talk to your doctor, pharmacist, or health care provider.  2023 [employer]vier/Gold Standard (2007-11-08 00:00:00)

## 2022-10-23 NOTE — Progress Notes (Signed)
   10/23/2022 10:10 AM   Nicole Bennett [birth year] [id number]  Reason for visit: Follow up recurrent UTI/GSM, history of microscopic hematuria  HPI: 77 year old female who I last saw in February 2021.  She was previously followed by Dr. Ernst Spell and Dr. Jacqlyn Larsen.  She has a history of a negative microscopic hematuria workup in 2013, and has had chronic ongoing low-grade microscopic hematuria.  She deferred further workup at our last visit.  She denies any gross hematuria.  She has been well-managed on topical estrogen cream, and at our last visit I stated this could be filled by PCP.  She made appointment today to refill her topical estrogen cream.  Overall, she has been doing well.  She denies any recurrent urinary infections, and the estrogen cream continues to work well.  She denies any gross hematuria.  Topical estrogen cream refilled, can be filled by PCP in the future, follow-up with urology as needed  [place] [facility], [place] [address] [place], [place] [id number] [phone]

## 2023-01-24 ENCOUNTER — Other Ambulatory Visit: Payer: Self-pay | Admitting: Physician Assistant

## 2023-01-24 DIAGNOSIS — Z1231 Encounter for screening mammogram for malignant neoplasm of breast: Secondary | ICD-10-CM

## 2023-03-19 ENCOUNTER — Ambulatory Visit
Admission: RE | Admit: 2023-03-19 | Discharge: 2023-03-19 | Disposition: A | Discharge status: Home / Self Care | Payer: Medicare Other | Source: Ambulatory Visit | Attending: Physician Assistant | Admitting: Physician Assistant

## 2023-03-19 DIAGNOSIS — Z1231 Encounter for screening mammogram for malignant neoplasm of breast: Secondary | ICD-10-CM | POA: Diagnosis present

## 2024-04-28 ENCOUNTER — Other Ambulatory Visit: Payer: Self-pay | Admitting: Physician Assistant

## 2024-04-28 DIAGNOSIS — Z1231 Encounter for screening mammogram for malignant neoplasm of breast: Secondary | ICD-10-CM

## 2024-05-15 ENCOUNTER — Ambulatory Visit
Admission: RE | Admit: 2024-05-15 | Discharge: 2024-05-15 | Disposition: A | Discharge status: Home / Self Care | Source: Ambulatory Visit | Attending: Physician Assistant | Admitting: Physician Assistant

## 2024-05-15 DIAGNOSIS — Z1231 Encounter for screening mammogram for malignant neoplasm of breast: Secondary | ICD-10-CM | POA: Insufficient documentation
